# Patient Record
Sex: Male | Born: 1980 | Race: Black or African American | Hispanic: No | Smoking: Never smoker
Health system: Southern US, Community
[De-identification: ages and names within clinical notes are randomized; demographics above are authoritative.]

---

## 2013-11-26 ENCOUNTER — Emergency Department (HOSPITAL_COMMUNITY): Payer: Self-pay

## 2013-11-26 ENCOUNTER — Emergency Department (HOSPITAL_COMMUNITY)
Admission: EM | Admit: 2013-11-26 | Discharge: 2013-11-26 | Disposition: A | Discharge status: Home / Self Care | Payer: Self-pay | Attending: Emergency Medicine | Admitting: Emergency Medicine

## 2013-11-26 ENCOUNTER — Encounter (HOSPITAL_COMMUNITY): Payer: Self-pay | Admitting: Emergency Medicine

## 2013-11-26 DIAGNOSIS — F17200 Nicotine dependence, unspecified, uncomplicated: Secondary | ICD-10-CM | POA: Insufficient documentation

## 2013-11-26 DIAGNOSIS — G479 Sleep disorder, unspecified: Secondary | ICD-10-CM | POA: Insufficient documentation

## 2013-11-26 DIAGNOSIS — R51 Headache: Secondary | ICD-10-CM | POA: Insufficient documentation

## 2013-11-26 DIAGNOSIS — K047 Periapical abscess without sinus: Secondary | ICD-10-CM | POA: Insufficient documentation

## 2013-11-26 DIAGNOSIS — R509 Fever, unspecified: Secondary | ICD-10-CM | POA: Insufficient documentation

## 2013-11-26 DIAGNOSIS — Z79899 Other long term (current) drug therapy: Secondary | ICD-10-CM | POA: Insufficient documentation

## 2013-11-26 LAB — BASIC METABOLIC PANEL
BUN: 9 mg/dL (ref 6–23)
CALCIUM: 9.6 mg/dL (ref 8.4–10.5)
CO2: 24 mEq/L (ref 19–32)
CREATININE: 0.93 mg/dL (ref 0.50–1.35)
Chloride: 100 mEq/L (ref 96–112)
GFR calc Af Amer: >90 mL/min (ref >90)
Glucose, Bld: 100 mg/dL — ABNORMAL HIGH (ref 70–99)
Potassium: 3.5 mEq/L — ABNORMAL LOW (ref 3.7–5.3)
SODIUM: 136 meq/L — AB (ref 137–147)

## 2013-11-26 LAB — CBC WITH DIFFERENTIAL/PLATELET
BASOS ABS: 0 10*3/uL (ref 0.0–0.1)
BASOS PCT: 0 % (ref 0–1)
EOS ABS: 0 10*3/uL (ref 0.0–0.7)
EOS PCT: 0 % (ref 0–5)
HEMATOCRIT: 36.6 % — AB (ref 39.0–52.0)
Hemoglobin: 12.9 g/dL — ABNORMAL LOW (ref 13.0–17.0)
Lymphocytes Relative: 15 % (ref 12–46)
Lymphs Abs: 1.3 10*3/uL (ref 0.7–4.0)
MCH: 30.5 pg (ref 26.0–34.0)
MCHC: 35.2 g/dL (ref 30.0–36.0)
MCV: 86.5 fL (ref 78.0–100.0)
MONO ABS: 1.9 10*3/uL — AB (ref 0.1–1.0)
Monocytes Relative: 23 % — ABNORMAL HIGH (ref 3–12)
Neutro Abs: 5.2 10*3/uL (ref 1.7–7.7)
Neutrophils Relative %: 62 % (ref 43–77)
PLATELETS: 196 10*3/uL (ref 150–400)
RBC: 4.23 MIL/uL (ref 4.22–5.81)
RDW: 13.3 % (ref 11.5–15.5)
WBC: 8.4 10*3/uL (ref 4.0–10.5)

## 2013-11-26 MED ORDER — SODIUM CHLORIDE 0.9 % IV BOLUS (SEPSIS)
1000.0000 mL | Freq: Once | INTRAVENOUS | Status: AC
  Administered 2013-11-26: 1000 mL via INTRAVENOUS

## 2013-11-26 MED ORDER — ACETAMINOPHEN 325 MG PO TABS
975.0000 mg | ORAL_TABLET | Freq: Once | ORAL | Status: AC
  Administered 2013-11-26: 975 mg via ORAL
  Filled 2013-11-26: qty 3

## 2013-11-26 MED ORDER — ONDANSETRON HCL 4 MG/2ML IJ SOLN
4.0000 mg | Freq: Once | INTRAMUSCULAR | Status: AC
  Administered 2013-11-26: 4 mg via INTRAVENOUS
  Filled 2013-11-26: qty 2

## 2013-11-26 MED ORDER — HYDROCODONE-ACETAMINOPHEN 5-325 MG PO TABS: 2.0000 | ORAL_TABLET | ORAL | Status: AC | PRN

## 2013-11-26 MED ORDER — MORPHINE SULFATE 4 MG/ML IJ SOLN
4.0000 mg | Freq: Once | INTRAMUSCULAR | Status: AC
  Administered 2013-11-26: 4 mg via INTRAVENOUS
  Filled 2013-11-26: qty 1

## 2013-11-26 MED ORDER — IOHEXOL 300 MG/ML  SOLN
80.0000 mL | Freq: Once | INTRAMUSCULAR | Status: AC | PRN
  Administered 2013-11-26: 80 mL via INTRAVENOUS

## 2013-11-26 MED ORDER — CLINDAMYCIN HCL 150 MG PO CAPS: 300.0000 mg | ORAL_CAPSULE | Freq: Three times a day (TID) | ORAL | Status: AC

## 2013-11-26 MED ORDER — CLINDAMYCIN PHOSPHATE 600 MG/50ML IV SOLN
600.0000 mg | Freq: Once | INTRAVENOUS | Status: AC
  Administered 2013-11-26: 600 mg via INTRAVENOUS
  Filled 2013-11-26: qty 50

## 2013-11-26 NOTE — Progress Notes (Signed)
[facility] provided pt with a list of primary care resources and a [facility] Orange Card application to help patient establish primary care.  °

## 2013-11-26 NOTE — ED Provider Notes (Signed)
Medical screening examination/treatment/procedure(s) were conducted as a shared visit with non-physician practitioner(s) and myself.  I personally evaluated the patient during the encounter.   EKG Interpretation None      Pt feeling better after pain meds. IV clindamycin given. Home on clindamycin. Oral surgery follow up scheduled for tomorrow. Large right sided facial swelling. No airway compromise. No signs of ludwigs angina at this point  Ct Soft Tissue Neck W Contrast  11/26/2013   CLINICAL DATA:  Right-sided pain and swelling.  EXAM: CT NECK WITH CONTRAST  TECHNIQUE: Multidetector CT imaging of the neck was performed using the standard protocol following the bolus administration of intravenous contrast.  CONTRAST:  80mL OMNIPAQUE IOHEXOL 300 MG/ML  SOLN  COMPARISON:  None.  FINDINGS: Visualized intracranial contents are normal. Visualized lung apices are clear.  Both parotid glands are normal. Both submandibular glands are normal. The thyroid gland is normal.  There is nonspecific soft tissue swelling of the right side of the face and upper neck consistent with infectious inflammation. There are reactive lymph nodes on the right primarily level 1. There is dental disease with multiple caries. There is a periapical abscess at tooth number 31 which has tracked anteriorly and broken through the lateral side of the mandible, probably at the mental foramen. Alternatively, it is possible that it is tracking up along the margins of tooth 31 itself.  .  IMPRESSION: Marked infectious inflammation of the soft tissues of the right side of the face and neck with some reactive nodal enlargement level 1. Origin probably relates to a root abscess at tooth 31 which has tracked anteriorly and may have broken through at the mental foramen. Alternatively, it could be decompressing along the roots of tooth number 31.   Electronically Signed   By: Paulina FusiMark  Shogry M.D.   On: 11/26/2013 16:02  I personally reviewed the imaging  tests through PACS system I reviewed available ER/hospitalization records through the EMR   Lyanne CoKevin M Monserratt Knezevic, MD 11/26/13 2100

## 2013-11-26 NOTE — ED Notes (Signed)
Pt A+OX4, c/o pain 6/10 to R side face/teeth.  +swelling noted to R jaw.  Pt reports onset "small bump on my gums" yesterday and tooth pain, woke up this AM with large amt swelling.  Pt speaking full/clear sentences, rr even/un-lab.  No difficulty clearing secretions or maintaining airway.  Pt denies fevers/chills.  NAD.

## 2013-11-26 NOTE — ED Provider Notes (Signed)
CSN: [id number]     Arrival date & time 11/26/13  1325 History   First MD Initiated Contact with Patient 11/26/13 1328     Chief Complaint  Patient presents with  . Dental Pain  . Facial Swelling     (Consider location/radiation/quality/duration/timing/severity/associated sxs/prior Treatment) HPI Comments: Patient presents to the emergency department with chief complaint of dental pain. He states that his pain started yesterday. He noticed a small bump on his gums yesterday, and when he woke this morning, he noticed a large amount of swelling. He endorses subjective fevers and chills. States he has no difficulty swallowing or breathing. He does not have a dentist. He states that his pain is severe. There are no aggravating or relieving factors.  The history is provided by the patient. No language interpreter was used.    History reviewed. No pertinent past medical history. History reviewed. No pertinent past surgical history. No family history on file. History  Substance Use Topics  . Smoking status: Current Every Day Smoker  . Smokeless tobacco: Not on file  . Alcohol Use: No    Review of Systems  Constitutional: Positive for fever. Negative for chills.  HENT: Positive for dental problem. Negative for drooling.   Neurological: Negative for speech difficulty.  Psychiatric/Behavioral: Positive for sleep disturbance.      Allergies  Shellfish allergy  Home Medications   Current Outpatient Rx  Name  Route  Sig  Dispense  Refill  . gabapentin (NEURONTIN) 100 MG capsule   Oral   Take 200 mg by mouth once.         Marland Kitchen ibuprofen (ADVIL,MOTRIN) 200 MG tablet   Oral   Take 200 mg by mouth once as needed for mild pain.          BP 119/64  Pulse 90  Temp(Src) 102.6 F (39.2 C) (Oral)  Resp 16  SpO2 97% Physical Exam  Nursing note and vitals reviewed. Constitutional: He is oriented to person, place, and time. He appears well-developed and well-nourished.  HENT:   Head: Normocephalic and atraumatic.  Lower right-sided dental abscess, extending from the cheek to the chin, no sublingual swelling, airway is patent  Eyes: Conjunctivae and EOM are normal.  Neck: Normal range of motion.  Cardiovascular: Normal rate.   Pulmonary/Chest: Effort normal.  Abdominal: He exhibits no distension.  Musculoskeletal: Normal range of motion.  Neurological: He is alert and oriented to person, place, and time.  Skin: Skin is dry.  Psychiatric: He has a normal mood and affect. His behavior is normal. Judgment and thought content normal.    ED Course  Procedures (including critical care time) Results for orders placed during the hospital encounter of 11/26/13  CBC WITH DIFFERENTIAL      Result Value Ref Range   WBC 8.4  4.0 - 10.5 K/uL   RBC 4.23  4.22 - 5.81 MIL/uL   Hemoglobin 12.9 (*) 13.0 - 17.0 g/dL   HCT 40.9 (*) 81.1 - 91.4 %   MCV 86.5  78.0 - 100.0 fL   MCH 30.5  26.0 - 34.0 pg   MCHC 35.2  30.0 - 36.0 g/dL   RDW 78.2  95.6 - 21.3 %   Platelets 196  150 - 400 K/uL   Neutrophils Relative % 62  43 - 77 %   Neutro Abs 5.2  1.7 - 7.7 K/uL   Lymphocytes Relative 15  12 - 46 %   Lymphs Abs 1.3  0.7 - 4.0 K/uL  Monocytes Relative 23 (*) 3 - 12 %   Monocytes Absolute 1.9 (*) 0.1 - 1.0 K/uL   Eosinophils Relative 0  0 - 5 %   Eosinophils Absolute 0.0  0.0 - 0.7 K/uL   Basophils Relative 0  0 - 1 %   Basophils Absolute 0.0  0.0 - 0.1 K/uL  BASIC METABOLIC PANEL      Result Value Ref Range   Sodium 136 (*) 137 - 147 mEq/L   Potassium 3.5 (*) 3.7 - 5.3 mEq/L   Chloride 100  96 - 112 mEq/L   CO2 24  19 - 32 mEq/L   Glucose, Bld 100 (*) 70 - 99 mg/dL   BUN 9  6 - 23 mg/dL   Creatinine, Ser 1.610.93  0.50 - 1.35 mg/dL   Calcium 9.6  8.4 - 09.610.5 mg/dL   GFR calc non Af Amer >90  >90 mL/min   GFR calc Af Amer >90  >90 mL/min   Ct Soft Tissue Neck W Contrast  11/26/2013   CLINICAL DATA:  Right-sided pain and swelling.  EXAM: CT NECK WITH CONTRAST  TECHNIQUE:  Multidetector CT imaging of the neck was performed using the standard protocol following the bolus administration of intravenous contrast.  CONTRAST:  80mL OMNIPAQUE IOHEXOL 300 MG/ML  SOLN  COMPARISON:  None.  FINDINGS: Visualized intracranial contents are normal. Visualized lung apices are clear.  Both parotid glands are normal. Both submandibular glands are normal. The thyroid gland is normal.  There is nonspecific soft tissue swelling of the right side of the face and upper neck consistent with infectious inflammation. There are reactive lymph nodes on the right primarily level 1. There is dental disease with multiple caries. There is a periapical abscess at tooth number 31 which has tracked anteriorly and broken through the lateral side of the mandible, probably at the mental foramen. Alternatively, it is possible that it is tracking up along the margins of tooth 31 itself.  .  IMPRESSION: Marked infectious inflammation of the soft tissues of the right side of the face and neck with some reactive nodal enlargement level 1. Origin probably relates to a root abscess at tooth 31 which has tracked anteriorly and may have broken through at the mental foramen. Alternatively, it could be decompressing along the roots of tooth number 31.   Electronically Signed   By: Paulina FusiMark  Shogry M.D.   On: 11/26/2013 16:02      EKG Interpretation None      MDM   Final diagnoses:  Dental abscess   Patient with large dental abscess and fever. Treat with Tylenol, check labs, give pain medicine, give clindamycin, and will order CT scan of the face. Patient discussed with Dr. Patria Maneampos, who agrees with the plan. Patient seen by and discussed with Dr. Patria Maneampos. 5:04 PM CT scan reviewed with Dr. Patria Maneampos and Dr. Jeanice Limurham.  Patient will be discharged on clindamycin and Vicodin. He is instructed to followup at Dr. Deirdre Peerurham's office tomorrow at 12:15. His instructed to present n.p.o.  return for new or worsening symptoms.  Filed Vitals:    11/26/13 1634  BP: 134/65  Pulse: 78  Temp: 98.5 F (36.9 C)  Resp: [address]18      Maebelle Sulton, PA-C 11/26/13 1705

## 2013-11-26 NOTE — Discharge Instructions (Signed)
Abscess An abscess is an infected area that contains a collection of pus and debris.It can occur in almost any part of the body. An abscess is also known as a furuncle or boil. CAUSES  An abscess occurs when tissue gets infected. This can occur from blockage of oil or sweat glands, infection of hair follicles, or a minor injury to the skin. As the body tries to fight the infection, pus collects in the area and creates pressure under the skin. This pressure causes pain. People with weakened immune systems have difficulty fighting infections and get certain abscesses more often.  SYMPTOMS Usually an abscess develops on the skin and becomes a painful mass that is red, warm, and tender. If the abscess forms under the skin, you may feel a moveable soft area under the skin. Some abscesses break open (rupture) on their own, but most will continue to get worse without care. The infection can spread deeper into the body and eventually into the bloodstream, causing you to feel ill.  DIAGNOSIS  Your caregiver will take your medical history and perform a physical exam. A sample of fluid may also be taken from the abscess to determine what is causing your infection. TREATMENT  Your caregiver may prescribe antibiotic medicines to fight the infection. However, taking antibiotics alone usually does not cure an abscess. Your caregiver may need to make a small cut (incision) in the abscess to drain the pus. In some cases, gauze is packed into the abscess to reduce pain and to continue draining the area. HOME CARE INSTRUCTIONS   Only take over-the-counter or prescription medicines for pain, discomfort, or fever as directed by your caregiver.  If you were prescribed antibiotics, take them as directed. Finish them even if you start to feel better.  If gauze is used, follow your caregiver's directions for changing the gauze.  To avoid spreading the infection:  Keep your draining abscess covered with a  bandage.  Wash your hands well.  Do not share personal care items, towels, or whirlpools with others.  Avoid skin contact with others.  Keep your skin and clothes clean around the abscess.  Keep all follow-up appointments as directed by your caregiver. SEEK MEDICAL CARE IF:   You have increased pain, swelling, redness, fluid drainage, or bleeding.  You have muscle aches, chills, or a general ill feeling.  You have a fever. MAKE SURE YOU:   Understand these instructions.  Will watch your condition.  Will get help right away if you are not doing well or get worse. Document Released: 05/24/2005 Document Revised: 02/13/2012 Document Reviewed: 10/27/2011 Exit[employer] Patient Information 2014 [employer].  

## 2015-08-16 IMAGING — CT CT NECK W/ CM
2 of 3 series · 8 of 14 positions shown, 9 images · IV contrast (OMNIPAQUE 300)
Comparison: None.

CLINICAL DATA: Right-sided pain and swelling.

EXAM:
CT NECK WITH CONTRAST
TECHNIQUE: Multidetector CT imaging of the neck was performed using the
standard protocol following the bolus administration of intravenous
contrast.
CONTRAST:  80mL OMNIPAQUE IOHEXOL 300 MG/ML  SOLN

[Series 2: neck with st · axial · 0.39mm/px · z∈[-203,-73]mm · 4 of 109 slices shown]
[im 22/109  bone]
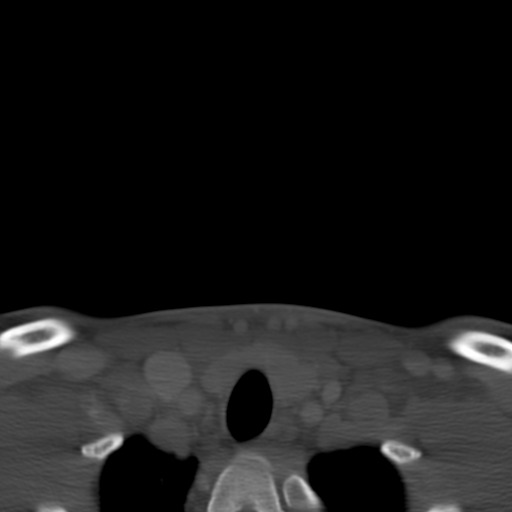
[im 44/109  bone]
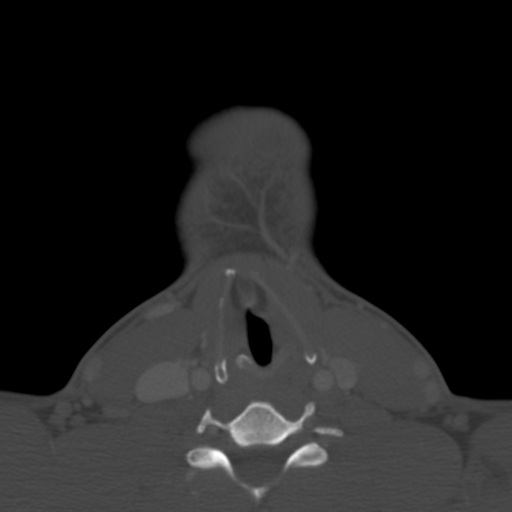
[im 65/109  bone]
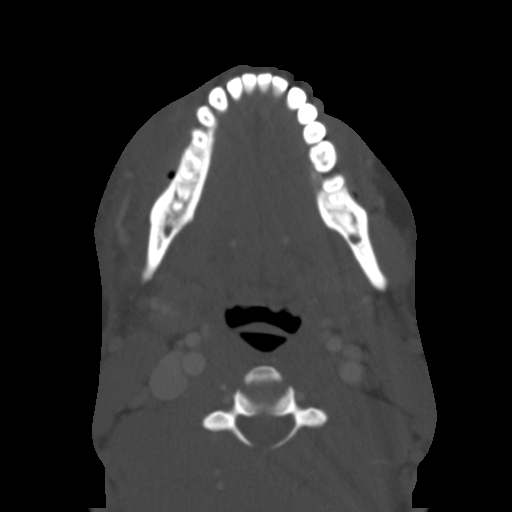
[im 87/109  bone]
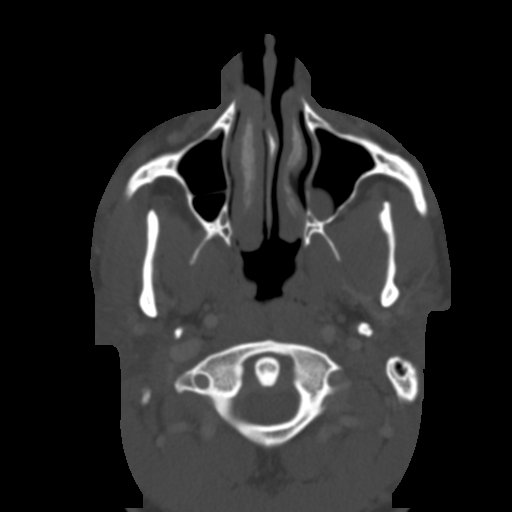

[Series 5: axial recons · axial · 0.39mm/px · z∈[-221,-92]mm · 4 of 112 slices shown, 5 images]
[im 23/112  soft-tissue]
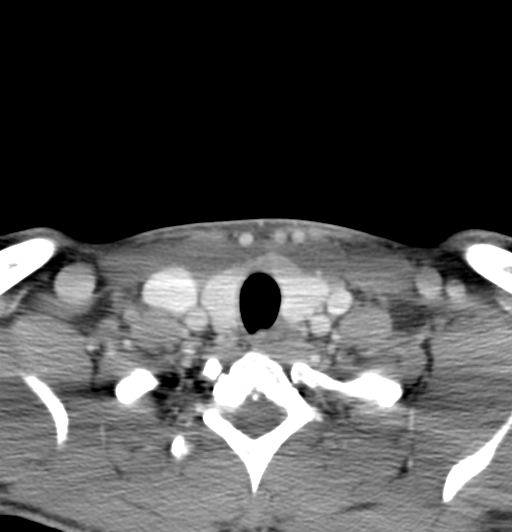
[im 23/112  bone]
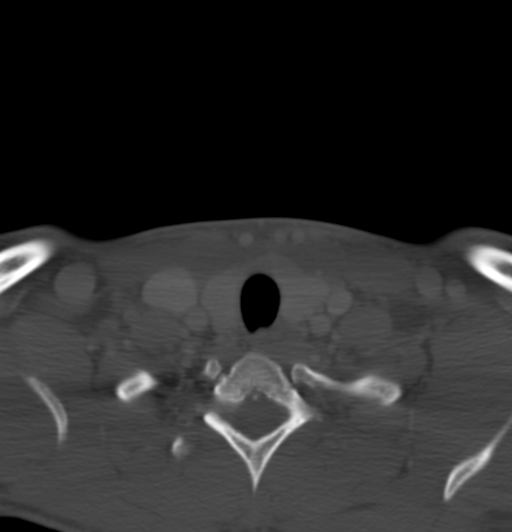
[im 45/112  bone]
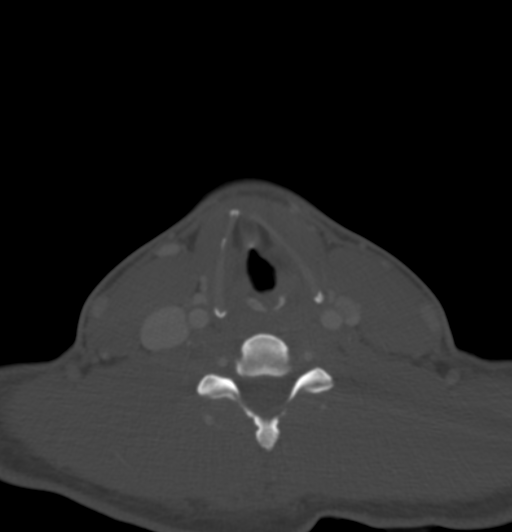
[im 67/112  bone]
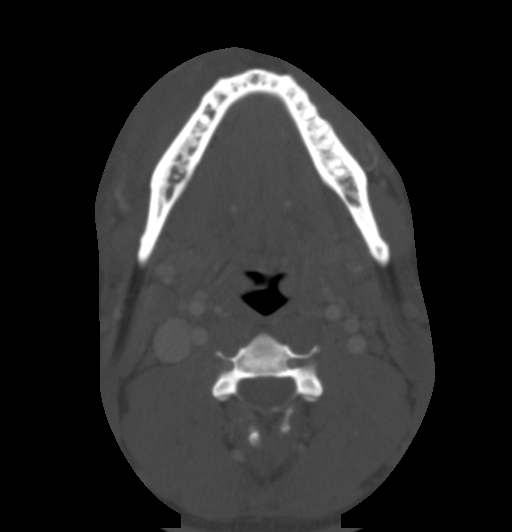
[im 89/112  bone]
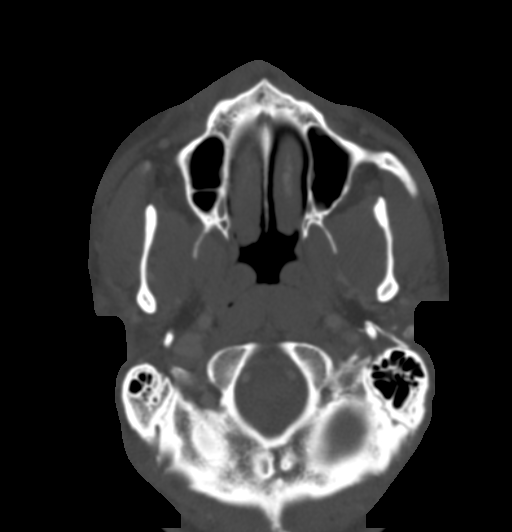

[8 of 14 positions shown; findings below may reference images not displayed]

FINDINGS: Visualized intracranial contents are normal. Visualized lung apices
are clear.

Both parotid glands are normal. Both submandibular glands are
normal. The thyroid gland is normal.

There is nonspecific soft tissue swelling of the right side of the
face and upper neck consistent with infectious inflammation. There
are reactive lymph nodes on the right primarily level 1. There is
dental disease with multiple caries. There is a periapical abscess
at tooth number 31 which has tracked anteriorly and broken through
the lateral side of the mandible, probably at the mental foramen.
Alternatively, it is possible that it is tracking up along the
margins of tooth 31 itself.

.
IMPRESSION: Marked infectious inflammation of the soft tissues of the right side
of the face and neck with some reactive nodal enlargement level 1.
Origin probably relates to a root abscess at tooth 31 which has
tracked anteriorly and may have broken through at the mental
foramen. Alternatively, it could be decompressing along the roots of
tooth number 31.

## 2021-07-11 ENCOUNTER — Encounter (HOSPITAL_BASED_OUTPATIENT_CLINIC_OR_DEPARTMENT_OTHER): Payer: Self-pay | Admitting: Urology

## 2021-07-11 ENCOUNTER — Other Ambulatory Visit (HOSPITAL_BASED_OUTPATIENT_CLINIC_OR_DEPARTMENT_OTHER): Payer: Self-pay

## 2021-07-11 ENCOUNTER — Emergency Department (HOSPITAL_BASED_OUTPATIENT_CLINIC_OR_DEPARTMENT_OTHER)
Admission: EM | Admit: 2021-07-11 | Discharge: 2021-07-11 | Disposition: A | Discharge status: Home / Self Care | Payer: Self-pay | Attending: Emergency Medicine | Admitting: Emergency Medicine

## 2021-07-11 DIAGNOSIS — R590 Localized enlarged lymph nodes: Secondary | ICD-10-CM | POA: Insufficient documentation

## 2021-07-11 DIAGNOSIS — L98491 Non-pressure chronic ulcer of skin of other sites limited to breakdown of skin: Secondary | ICD-10-CM | POA: Insufficient documentation

## 2021-07-11 MED ORDER — DOXYCYCLINE HYCLATE 100 MG PO CAPS
100.0000 mg | ORAL_CAPSULE | Freq: Two times a day (BID) | ORAL | 0 refills | Status: AC
  Filled 2021-07-11: qty 42, 21d supply, fill #0

## 2021-07-11 MED ORDER — LIDOCAINE-EPINEPHRINE 2 %-1:100000 IJ SOLN: 20.0000 mL | Freq: Once | INTRAMUSCULAR | Status: DC

## 2021-07-11 NOTE — Discharge Instructions (Addendum)
If your symptoms are not improving, or you develop weight loss, night sweats,unexplained fevers or lymph node swelling in other areas you need to be reevaluated.  This kind of ulcer could represent a Sexually transmitted illness called lymphogranuloma venerum and it is recommended that you follow up for full STI testing (including HIV and Syphilis) at any of the sites listed on this paperwork.  Return for any new or worsening concerns.  Free HIV and STD Testing These locations offer FREE confidential testing for HIV, Chlamydia, Gonorrhea, and Syphilis. Non-Traditional Testing Sites Address Telephone  [facility] [address] [place] [phone] Mondays 5pm - 7pm  [facility] [address], [address] [place] [phone] Wednesdays 2pm-8pm  [employer] [facility] [address] [place] [phone] Thursdays 9am-12noon 1pm-4pm  [facility] [address] [place] [phone] Tuesdays Thursdays 9am-12noon 1pm-4pm  [facility] Department of [employer] offers free, confidential testing and treatment for HIV, Chlamydia, Gonorrhea, Syphilis, Herpes, Bacterial Vaginosis, Yeast, and Trichomoniasis. Traditional Testing   [facility] [facility] [address] [place] [phone]  Monday thru Friday  Call for an appointment  [facility]- [facility] [address] [place] [phone] Monday thru Friday  Call for anappointment.  If you have any questions about this information please call [phone]. 07/06/2011

## 2021-07-11 NOTE — ED Provider Notes (Signed)
[facility] EMERGENCY DEPARTMENT Provider Note   CSN: [id number] Arrival date & time: 07/11/21  1430     History Chief Complaint  Patient presents with   Abscess    Roger Baker is a 40 y.o. male who presents emergency department with chief complaint of groin swelling.  Patient states that 2 weeks ago while he was on a cruise he developed swelling in his right groin which has progressively worsened.  He describes it as tender, he feels multiple lumps under the skin.  Patient states that 1 week ago he developed a dry rash in his groin which was itchy.  He then developed an area of ulceration on the side of his scrotum.  He denies urinary symptoms, testicle pain, discharge from his penis.  He states that he has not had unprotected sexual intercourse with anyone for months.   Abscess     History reviewed. No pertinent past medical history.  There are no problems to display for this patient.   History reviewed. No pertinent surgical history.     History reviewed. No pertinent family history.  Social History   Tobacco Use   Smoking status: Never  Substance Use Topics   Alcohol use: No   Drug use: Yes    Types: Marijuana    Comment: occ    Home Medications Prior to Admission medications   Medication Sig Start Date End Date Taking? Authorizing Provider  doxycycline (VIBRAMYCIN) 100 MG capsule Take 1 capsule (100 mg total) by mouth 2 (two) times daily. WITH FOOD and a full glass of water! 07/11/21  Yes Hargun Spurling, PA-C  clindamycin (CLEOCIN) 150 MG capsule Take 2 capsules (300 mg total) by mouth 3 (three) times daily. May dispense as 150mg  capsules 11/26/13   01/26/14, PA-C  gabapentin (NEURONTIN) 100 MG capsule Take 200 mg by mouth once.    [provider]  HYDROcodone-acetaminophen (NORCO/VICODIN) 5-325 MG per tablet Take 2 tablets by mouth every 4 (four) hours as needed. 11/26/13   01/26/14, PA-C  ibuprofen (ADVIL,MOTRIN) 200 MG  tablet Take 200 mg by mouth once as needed for mild pain.    [provider]    Allergies    Shellfish allergy  Review of Systems   Review of Systems Ten systems reviewed and are negative for acute change, except as noted in the HPI.   Physical Exam Updated Vital Signs BP 136/68 (BP Location: Right Arm)   Pulse 84   Temp 98.6 F (37 C) (Oral)   Resp 14   Ht 5\' 10"  (1.778 m)   SpO2 98%   Physical Exam Vitals and nursing note reviewed. Exam conducted with a chaperone present.  Constitutional:      General: He is not in acute distress.    Appearance: He is well-developed. He is not diaphoretic.  HENT:     Head: Normocephalic and atraumatic.  Eyes:     General: No scleral icterus.    Conjunctiva/sclera: Conjunctivae normal.  Cardiovascular:     Rate and Rhythm: Normal rate and regular rhythm.     Heart sounds: Normal heart sounds.  Pulmonary:     Effort: Pulmonary effort is normal. No respiratory distress.     Breath sounds: Normal breath sounds.  Abdominal:     Palpations: Abdomen is soft.     Tenderness: There is no abdominal tenderness.  Genitourinary:    Pubic Area: Rash present.    Musculoskeletal:     Cervical back: Normal range of motion  and neck supple.  Lymphadenopathy:     Lower Body: Right inguinal adenopathy present.  Skin:    General: Skin is warm and dry.  Neurological:     Mental Status: He is alert.  Psychiatric:        Behavior: Behavior normal.  yh  ED Results / Procedures / Treatments   Labs (all labs ordered are listed, but only abnormal results are displayed) Labs Reviewed - No data to display  EKG None  Radiology No results found.  Procedures Procedures   Medications Ordered in ED Medications - No data to display  ED Course  I have reviewed the triage vital signs and the nursing notes.  Pertinent labs & imaging results that were available during my care of the patient were reviewed by me and considered in my medical  decision making (see chart for details).    MDM Rules/Calculators/A&P                          Patient here with painful ulcer of the scrotal region. 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 Will treat with 21 day course of doxycycline. Low suspicion for syphilis, lymphoma, cat scratch fever. Patient advised to get full STI testing including and especially HIV and Syphilis testing for coinfection. Discussed OP f/u and return precautions Final Clinical Impression(s) / ED Diagnoses Final diagnoses:  Non-healing ulcer of groin, limited to breakdown of skin (HCC)  Inguinal lymphadenopathy    Rx / DC Orders ED Discharge Orders          Ordered    doxycycline (VIBRAMYCIN) 100 MG capsule  2 times daily        07/11/21 1705             Arthor Captain, PA-C 07/11/21 1719    Gloris Manchester, MD 07/12/21 1728

## 2021-07-11 NOTE — ED Triage Notes (Signed)
Right sided groin cyst that started 2 weeks ago, denies any drainage.
